# Patient Record
Sex: Male | Born: 1975 | Race: Black or African American | Hispanic: No | Marital: Single | Smoking: Current every day smoker
Health system: Southern US, Community
[De-identification: ages and names within clinical notes are randomized; demographics above are authoritative.]

---

## 2004-07-16 ENCOUNTER — Other Ambulatory Visit: Payer: Self-pay

## 2005-06-07 ENCOUNTER — Emergency Department: Payer: Self-pay | Admitting: Emergency Medicine

## 2006-04-18 ENCOUNTER — Emergency Department: Payer: Self-pay | Admitting: Emergency Medicine

## 2006-06-28 ENCOUNTER — Emergency Department: Payer: Self-pay | Admitting: Unknown Physician Specialty

## 2007-06-27 ENCOUNTER — Emergency Department: Payer: Self-pay | Admitting: Emergency Medicine

## 2008-05-22 ENCOUNTER — Emergency Department: Payer: Self-pay | Admitting: Emergency Medicine

## 2011-01-09 ENCOUNTER — Emergency Department: Payer: Self-pay | Admitting: Internal Medicine

## 2012-04-29 ENCOUNTER — Emergency Department: Payer: Self-pay | Admitting: Emergency Medicine

## 2012-07-22 ENCOUNTER — Emergency Department: Payer: Self-pay | Admitting: *Deleted

## 2012-07-22 LAB — URINALYSIS, COMPLETE
Bacteria: NONE SEEN
Bilirubin,UR: NEGATIVE
Leukocyte Esterase: NEGATIVE
Ph: 5 (ref 4.5–8.0)
Specific Gravity: 1.027 (ref 1.003–1.030)
Squamous Epithelial: <1

## 2012-07-22 LAB — COMPREHENSIVE METABOLIC PANEL
Albumin: 4.4 g/dL (ref 3.4–5.0)
Alkaline Phosphatase: 65 U/L (ref 50–136)
Anion Gap: 7 (ref 7–16)
Calcium, Total: 9.2 mg/dL (ref 8.5–10.1)
EGFR (Non-African Amer.): >60
Glucose: 84 mg/dL (ref 65–99)
Potassium: 3.3 mmol/L — ABNORMAL LOW (ref 3.5–5.1)
SGOT(AST): 57 U/L — ABNORMAL HIGH (ref 15–37)
SGPT (ALT): 51 U/L (ref 12–78)
Total Protein: 8.2 g/dL (ref 6.4–8.2)

## 2012-07-22 LAB — DRUG SCREEN, URINE
Amphetamines, Ur Screen: NEGATIVE (ref ?–1000)
Benzodiazepine, Ur Scrn: NEGATIVE (ref ?–200)
Cocaine Metabolite,Ur ~~LOC~~: POSITIVE (ref ?–300)
MDMA (Ecstasy)Ur Screen: NEGATIVE (ref ?–500)
Methadone, Ur Screen: NEGATIVE (ref ?–300)
Phencyclidine (PCP) Ur S: NEGATIVE (ref ?–25)
Tricyclic, Ur Screen: NEGATIVE (ref ?–1000)

## 2012-07-22 LAB — CBC
HGB: 15.3 g/dL (ref 13.0–18.0)
MCHC: 32.9 g/dL (ref 32.0–36.0)
MCV: 88 fL (ref 80–100)

## 2012-07-22 LAB — ETHANOL: Ethanol %: <0.003 % (ref 0.000–0.080)

## 2013-08-25 ENCOUNTER — Emergency Department: Payer: Self-pay | Admitting: Emergency Medicine

## 2014-03-25 ENCOUNTER — Emergency Department: Payer: Self-pay | Admitting: Emergency Medicine

## 2016-03-26 ENCOUNTER — Emergency Department: Payer: Self-pay

## 2016-03-26 ENCOUNTER — Emergency Department
Admission: EM | Admit: 2016-03-26 | Discharge: 2016-03-26 | Disposition: A | Discharge status: Home / Self Care | Payer: Self-pay | Attending: Emergency Medicine | Admitting: Emergency Medicine

## 2016-03-26 ENCOUNTER — Encounter: Payer: Self-pay | Admitting: Emergency Medicine

## 2016-03-26 DIAGNOSIS — M13862 Other specified arthritis, left knee: Secondary | ICD-10-CM | POA: Insufficient documentation

## 2016-03-26 DIAGNOSIS — F17200 Nicotine dependence, unspecified, uncomplicated: Secondary | ICD-10-CM | POA: Insufficient documentation

## 2016-03-26 DIAGNOSIS — M1712 Unilateral primary osteoarthritis, left knee: Secondary | ICD-10-CM

## 2016-03-26 MED ORDER — NAPROXEN 500 MG PO TABS: 500.0000 mg | ORAL_TABLET | Freq: Two times a day (BID) | ORAL | Status: DC

## 2016-03-26 NOTE — ED Notes (Signed)
Pt to ed with c/o left knee pain x 1 week, denies recent injury, but reports "it was a long time ago when I messed it up"

## 2016-03-26 NOTE — Discharge Instructions (Signed)

## 2016-03-26 NOTE — ED Provider Notes (Signed)
[facility] Emergency Department Provider Note  ____________________________________________  Time seen: Approximately 8:00 AM  I have reviewed the triage vital signs and the nursing notes.   HISTORY  Chief Complaint Knee Pain    HPI Tim Wallace is a 40 y.o. male , NAD, presents emergency department with 1 week history of left knee pain and swelling. States that his knee hurts more in the morning and decreases through the day with movement. Has mild pain most of the time. States that he twisted his knee approximately one week ago while walking and noted some swelling. Has been taking "fluid pills" from his mother to decrease the swelling which has worked. Has not taken anything over-the-counter for his pain. Denies any falls or traumas to the left knee. States he hurt his knee some years ago but has not had any follow-up with medical providers since that time. Denies numbness, weakness, tingling. Has not noted any redness or warmth to the area. No skin sores, open wounds nor oozing or weeping. Has not noted any edema in his lower extremities.   History reviewed. No pertinent past medical history.  There are no active problems to display for this patient.   History reviewed. No pertinent past surgical history.  Current Outpatient Rx  Name  Route  Sig  Dispense  Refill  . naproxen (NAPROSYN) 500 MG tablet   Oral   Take 1 tablet (500 mg total) by mouth 2 (two) times daily with a meal.   14 tablet   0     Allergies Review of patient's allergies indicates no known allergies.  History reviewed. No pertinent family history.  Social History Social History  Substance Use Topics  . Smoking status: Current Every Day Smoker  . Smokeless tobacco: None  . Alcohol Use: Yes     Review of Systems  Constitutional: No fever/chills, fatigue Cardiovascular: No chest pain. Respiratory: No shortness of breath. No wheezing.  Musculoskeletal: Positive left  knee pain. Negative for back and hip pain.  Skin: Negative for rash or redness, swelling, skin source. Neurological: Negative for headaches, focal weakness or numbness. No tingling. 10-point ROS otherwise negative.  ____________________________________________   PHYSICAL EXAM:  VITAL SIGNS: ED Triage Vitals  Enc Vitals Group     BP --      Pulse --      Resp --      Temp --      Temp src --      SpO2 --      Weight --      Height --      Head Cir --      Peak Flow --      Pain Score 03/26/16 0755 7     Pain Loc --      Pain Edu? --      Excl. in GC? --      Constitutional: Alert and oriented. Well appearing and in no acute distress. Eyes: Conjunctivae are normal.  Head: Atraumatic. Cardiovascular: Normal rate, regular rhythm. Normal S1 and S2.  Good peripheral circulation with 2+ pulses noted in the left lower extremity. Respiratory: Normal respiratory effort without tachypnea or retractions. Lungs CTAB with breath sounds noted in all lung fields. Musculoskeletal: Full range of motion of left knee with some posterior pain in full extension. No masses or swelling noted to the posterior knee. Negative patellofemoral grind test. No laxity with anterior, posterior drawer nor varus or valgus stress. Negative Lockman's on the left. No  lower extremity tenderness nor edema.  No joint effusions. Neurologic:  Normal speech and language. No gross focal neurologic deficits are appreciated.  Skin:  Skin is warm, dry and intact. No rash noted. Psychiatric: Mood and affect are normal. Speech and behavior are normal. Patient exhibits appropriate insight and judgement.   ____________________________________________   LABS  None ____________________________________________  EKG  None ____________________________________________  RADIOLOGY I have personally viewed and evaluated these images (plain radiographs) as part of my medical decision making, as well as reviewing the written  report by the radiologist.  Dg Knee Complete 4 Views Left  03/26/2016  CLINICAL DATA:  Knee pain EXAM: LEFT KNEE - COMPLETE 4+ VIEW COMPARISON:  None. FINDINGS: No acute fracture. No dislocation. Unremarkable soft tissues. Mild tricompartment osteoarthritic change. Small bone fragments are present in the distal quadriceps tendon, and distal patellar tendon. These have a chronic appearance. IMPRESSION: No acute bony pathology. Electronically Signed   By: Jolaine Click M.D.   On: 03/26/2016 08:29    ____________________________________________    PROCEDURES  Procedure(s) performed: None    Medications - No data to display   ____________________________________________   INITIAL IMPRESSION / ASSESSMENT AND PLAN / ED COURSE  Pertinent imaging results that were available during my care of the patient were reviewed by me and considered in my medical decision making (see chart for details).  Patient's diagnosis is consistent with arthritis of the left knee. Patient will be discharged home with prescriptions for naproxen to take as directed. Patient is to follow up with [facility] if symptoms persist past this treatment course. Patient is given ED precautions to return to the ED for any worsening or new symptoms.      ____________________________________________  FINAL CLINICAL IMPRESSION(S) / ED DIAGNOSES  Final diagnoses:  Arthritis of left knee      NEW MEDICATIONS STARTED DURING THIS VISIT:  Discharge Medication List as of 03/26/2016  9:16 AM    START taking these medications   Details  naproxen (NAPROSYN) 500 MG tablet Take 1 tablet (500 mg total) by mouth 2 (two) times daily with a meal., Starting 03/26/2016, Until Discontinued, Print         This chart was dictated using voice recognition software/Dragon. Despite best efforts to proofread, errors can occur which can change the meaning. Any change was purely unintentional.    Hope Pigeon,  PA-C 03/26/16 1025  Governor Rooks, MD 03/26/16 1317

## 2016-03-26 NOTE — ED Notes (Signed)
Pt states he has swelling in his left knee. He took one of his mother's "fluid pills" and it decreased the swelling but "it is still swollen and painful".

## 2016-03-26 NOTE — ED Notes (Signed)
Pt verbalized understanding of discharge instructions. NAD at this time. 

## 2017-05-03 ENCOUNTER — Emergency Department
Admission: EM | Admit: 2017-05-03 | Discharge: 2017-05-03 | Disposition: A | Discharge status: Home / Self Care | Payer: Self-pay | Attending: Emergency Medicine | Admitting: Emergency Medicine

## 2017-05-03 ENCOUNTER — Encounter: Payer: Self-pay | Admitting: Emergency Medicine

## 2017-05-03 DIAGNOSIS — F17200 Nicotine dependence, unspecified, uncomplicated: Secondary | ICD-10-CM | POA: Insufficient documentation

## 2017-05-03 DIAGNOSIS — L03011 Cellulitis of right finger: Secondary | ICD-10-CM | POA: Insufficient documentation

## 2017-05-03 MED ORDER — SULFAMETHOXAZOLE-TRIMETHOPRIM 800-160 MG PO TABS: 1.0000 | ORAL_TABLET | Freq: Two times a day (BID) | ORAL | 0 refills | Status: DC

## 2017-05-03 MED ORDER — TRAMADOL HCL 50 MG PO TABS: 50.0000 mg | ORAL_TABLET | Freq: Two times a day (BID) | ORAL | 0 refills | Status: DC | PRN

## 2017-05-03 NOTE — ED Triage Notes (Signed)
Pt c/o pain to right 3rd digit. Swelling to distal part of finger. No redness or fevers. No drainage. Pain with movement.  Pt does not remember injury.  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

## 2017-05-03 NOTE — ED Notes (Signed)
See triage note   States he developed pain and swelling to right middle finger about 4-5 days ago   Denies any injury increased pain with movement

## 2017-05-03 NOTE — ED Provider Notes (Signed)
[facility] Emergency Department Provider Note   ____________________________________________   First MD Initiated Contact with Patient 05/03/17 1550     (approximate)  I have reviewed the triage vital signs and the nursing notes.   HISTORY  Chief Complaint Hand Pain    HPI Tim DickinsonLarry D Wallace is a 41 y.o. male atient was some redness and swelling to the third digit right hand. Complaining is confined to the medial nailbed of the affected digit. Patient denies any trauma but admits to nail biting. Patient rates pain as 8/10. No palliative measures for this complaint.  History reviewed. No pertinent past medical history.  There are no active problems to display for this patient.   History reviewed. No pertinent surgical history.  Prior to Admission medications   Medication Sig Start Date End Date Taking? Authorizing Provider  naproxen (NAPROSYN) 500 MG tablet Take 1 tablet (500 mg total) by mouth 2 (two) times daily with a meal. 03/26/16   Hagler, Jami L, PA-C  sulfamethoxazole-trimethoprim (BACTRIM DS,SEPTRA DS) 800-160 MG tablet Take 1 tablet by mouth 2 (two) times daily. 05/03/17   Joni ReiningSmith, Kamaljit Hizer K, PA-C  traMADol (ULTRAM) 50 MG tablet Take 1 tablet (50 mg total) by mouth every 12 (twelve) hours as needed. 05/03/17   Joni ReiningSmith, Shantanique Hodo K, PA-C    Allergies Patient has no known allergies.  History reviewed. No pertinent family history.  Social History Social History  Substance Use Topics  . Smoking status: Current Every Day Smoker  . Smokeless tobacco: Never Used  . Alcohol use Yes    Review of Systems  Constitutional: No fever/chills Eyes: No visual changes. ENT: No sore throat. Cardiovascular: Denies chest pain. Respiratory: Denies shortness of breath. Gastrointestinal: No abdominal pain.  No nausea, no vomiting.  No diarrhea.  No constipation. Genitourinary: Negative for dysuria. Musculoskeletal: Negative for back pain. Skin: Negative for  rash. Redness and swelling to the third digit right nailbed. Neurological: Negative for headaches, focal weakness or numbness.   ____________________________________________   PHYSICAL EXAM:  VITAL SIGNS: ED Triage Vitals  Enc Vitals Group     BP 05/03/17 1534 129/72     Pulse Rate 05/03/17 1534 63     Resp 05/03/17 1534 18     Temp 05/03/17 1534 98.3 F (36.8 C)     Temp Source 05/03/17 1534 Oral     SpO2 05/03/17 1534 94 %     Weight 05/03/17 1534 170 lb (77.1 kg)     Height 05/03/17 1534 5\' 7"  (1.702 m)     Head Circumference --      Peak Flow --      Pain Score 05/03/17 1533 8     Pain Loc --      Pain Edu? --      Excl. in GC? --     Constitutional: Alert and oriented. Well appearing and in no acute distress. Cardiovascular: Normal rate, regular rhythm. Grossly normal heart sounds.  Good peripheral circulation. Respiratory: Normal respiratory effort.  No retractions. Lungs CTAB. Neurologic:  Normal speech and language. No gross focal neurologic deficits are appreciated. No gait instability. Skin:  edema and erythema to the nailbed of the third digit right hand. Psychiatric: Mood and affect are normal. Speech and behavior are normal.  ____________________________________________   LABS (all labs ordered are listed, but only abnormal results are displayed)  Labs Reviewed - No data to display ____________________________________________  EKG   ____________________________________________  RADIOLOGY   ____________________________________________   PROCEDURES  Procedure(s) performed: patient refuses I&D  Procedures  Critical Care performed: No  ____________________________________________   INITIAL IMPRESSION / ASSESSMENT AND PLAN / ED COURSE  Pertinent labs & imaging results that were available during my care of the patient were reviewed by me and considered in my medical decision making (see chart for details).  Paronychia third digit right  hand. Patient refused to have the lesion incised and drained. Patient given discharge care instructions. Patient given a prescription for Bactrim DS and tramadol. Advised to follow[facility] if condition persists.      ____________________________________________   FINAL CLINICAL IMPRESSION(S) / ED DIAGNOSES  Final diagnoses:  Paronychia of finger, right      NEW MEDICATIONS STARTED DURING THIS VISIT:  New Prescriptions   SULFAMETHOXAZOLE-TRIMETHOPRIM (BACTRIM DS,SEPTRA DS) 800-160 MG TABLET    Take 1 tablet by mouth 2 (two) times daily.   TRAMADOL (ULTRAM) 50 MG TABLET    Take 1 tablet (50 mg total) by mouth every 12 (twelve) hours as needed.     Note:  This document was prepared using Dragon voice recognition software and may include unintentional dictation errors.    Joni Reining, PA-C 05/03/17 1601    Minna Antis, MD 05/04/17 1426

## 2017-05-03 NOTE — Discharge Instructions (Signed)
Epsom salt soak twice a day for 5 minutes.

## 2017-09-24 IMAGING — CR DG KNEE COMPLETE 4+V*L*
4 series · 4 of 4 positions shown · non-contrast
Comparison: None.

CLINICAL DATA: Knee pain

EXAM:
LEFT KNEE - COMPLETE 4+ VIEW

[knee ap]
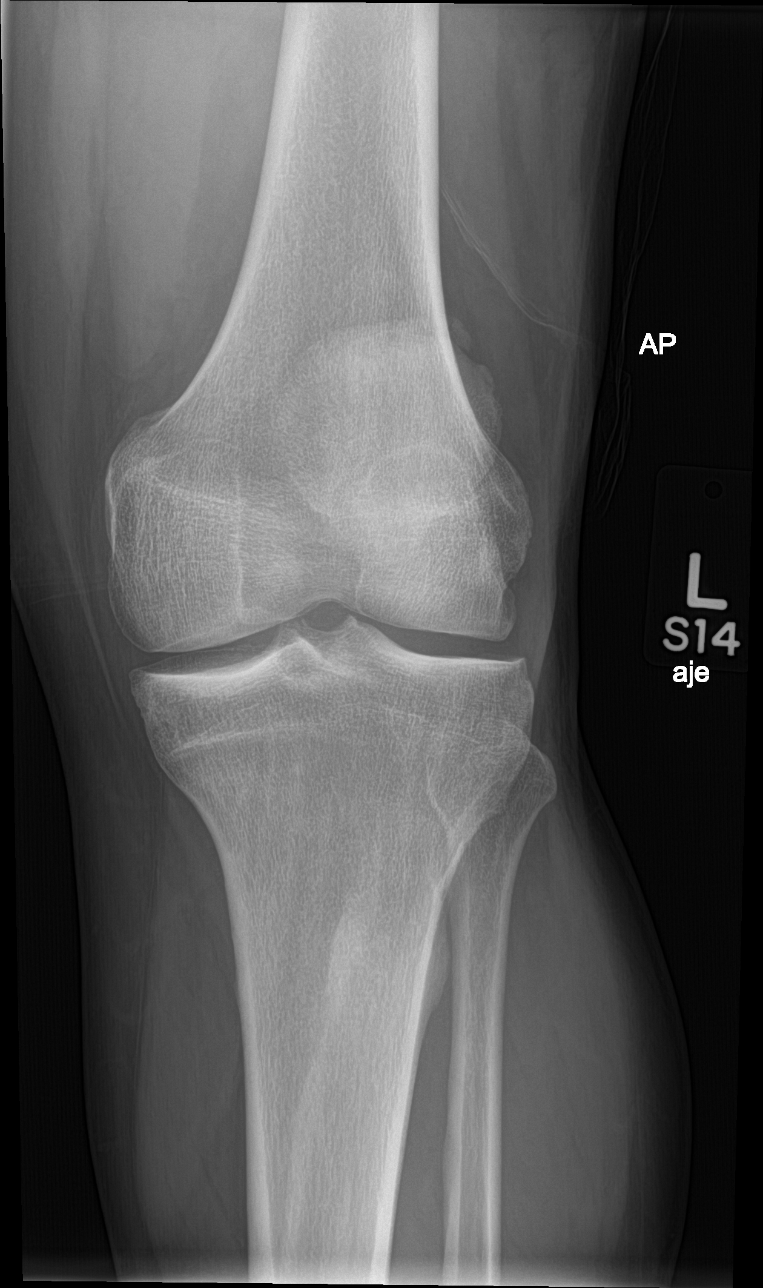

[knee obl]
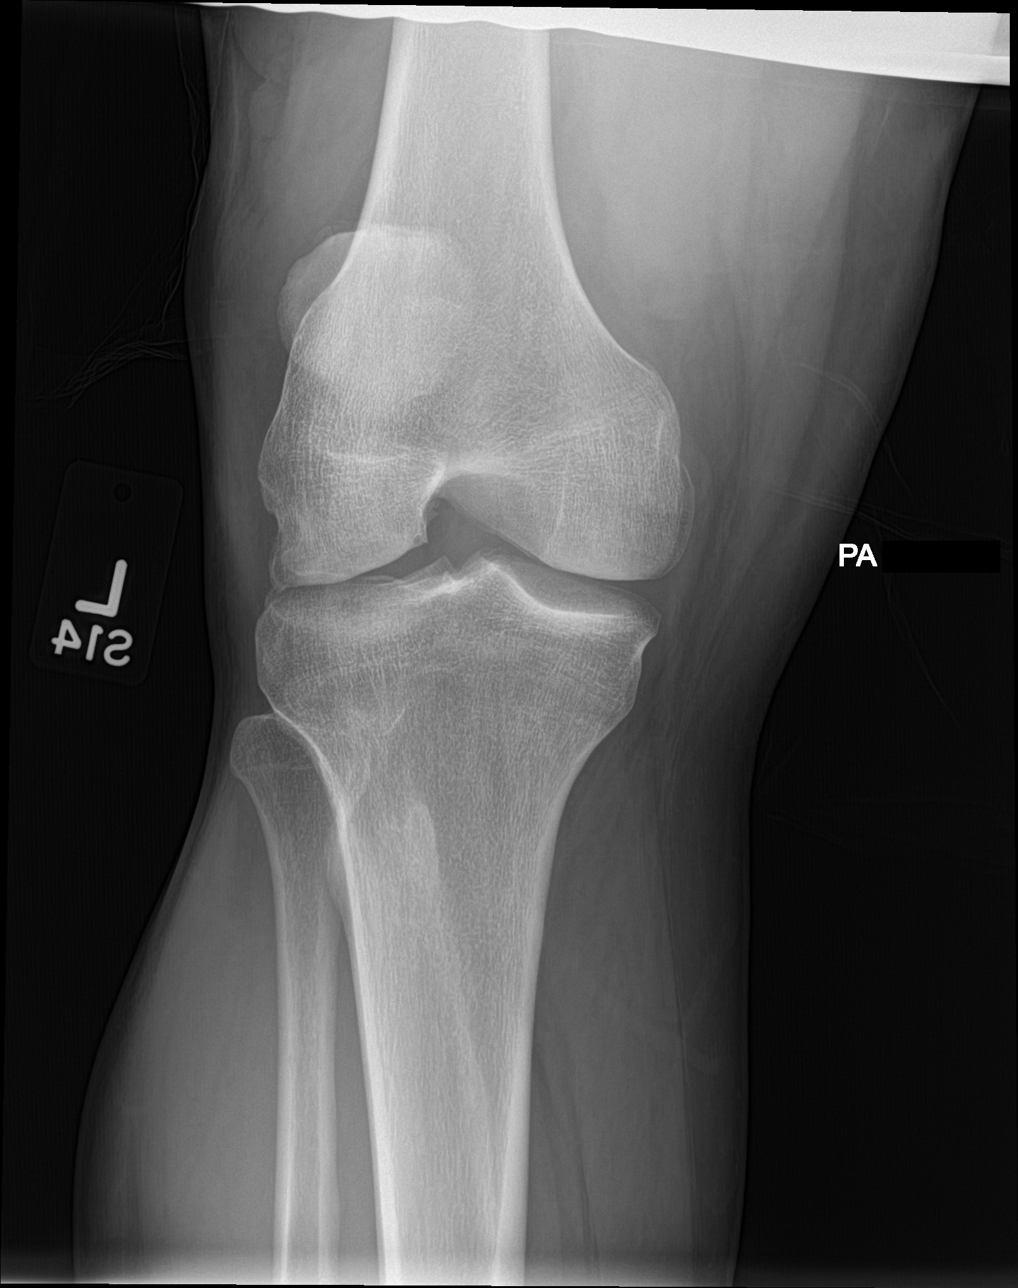

[knee lat]
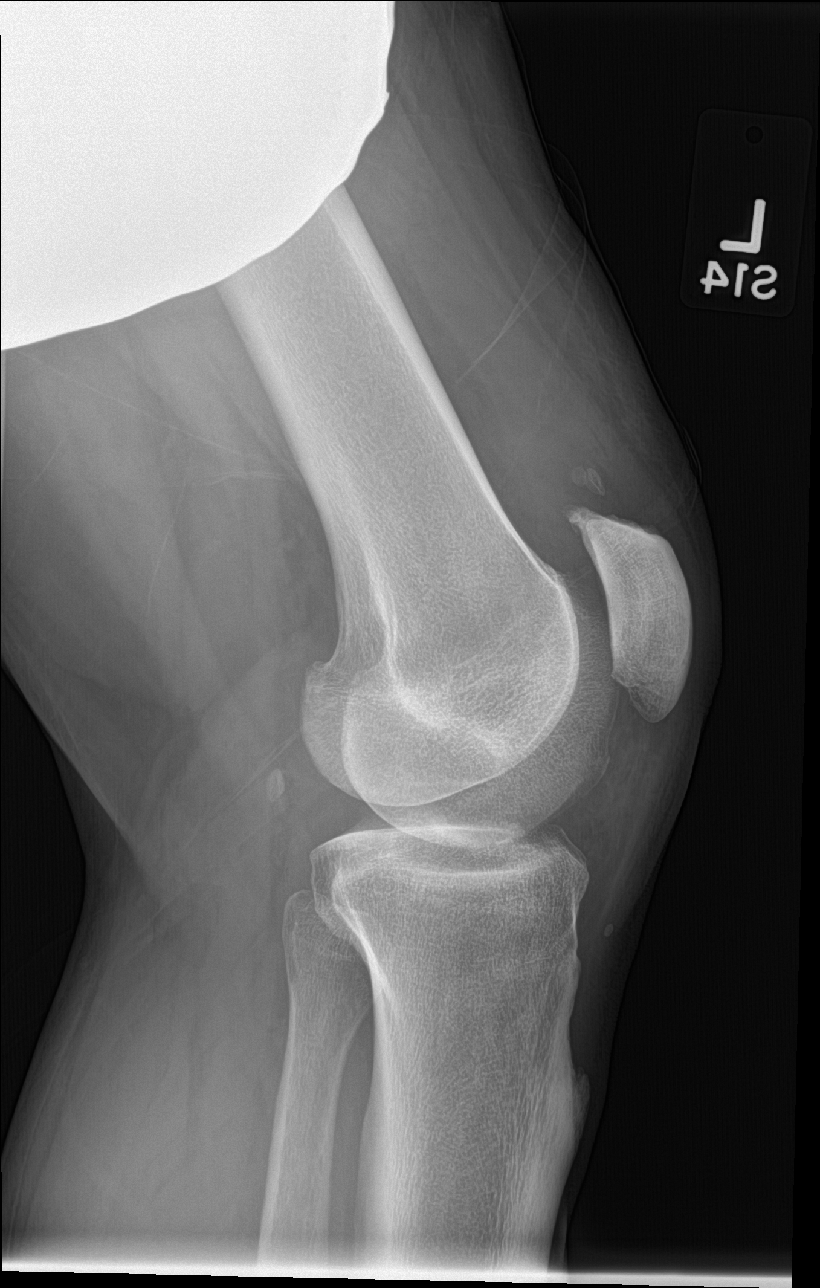

[sunrise]
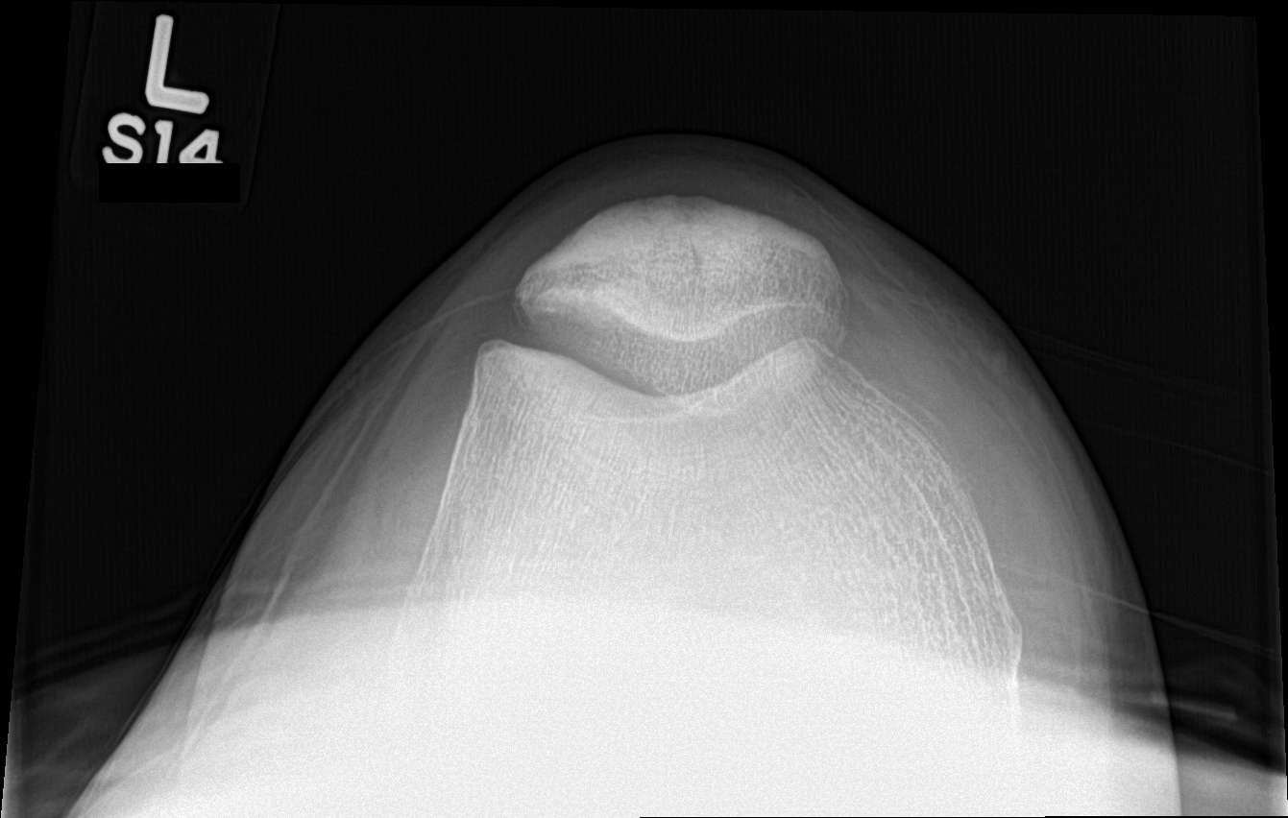

[4 of 4 positions shown; findings below may reference images not displayed]

FINDINGS: No acute fracture. No dislocation. Unremarkable soft tissues. Mild
tricompartment osteoarthritic change. Small bone fragments are
present in the distal quadriceps tendon, and distal patellar tendon.
These have a chronic appearance.
IMPRESSION: No acute bony pathology.

## 2020-11-20 ENCOUNTER — Other Ambulatory Visit: Payer: Self-pay

## 2020-11-20 ENCOUNTER — Encounter: Payer: Self-pay | Admitting: *Deleted

## 2020-11-20 ENCOUNTER — Emergency Department
Admission: EM | Admit: 2020-11-20 | Discharge: 2020-11-20 | Disposition: A | Discharge status: Home / Self Care | Payer: Self-pay | Attending: Emergency Medicine | Admitting: Emergency Medicine

## 2020-11-20 DIAGNOSIS — J029 Acute pharyngitis, unspecified: Secondary | ICD-10-CM | POA: Insufficient documentation

## 2020-11-20 DIAGNOSIS — F17200 Nicotine dependence, unspecified, uncomplicated: Secondary | ICD-10-CM | POA: Insufficient documentation

## 2020-11-20 LAB — GROUP A STREP BY PCR: Group A Strep by PCR: NOT DETECTED

## 2020-11-20 MED ORDER — DEXAMETHASONE 10 MG/ML FOR PEDIATRIC ORAL USE
10.0000 mg | Freq: Once | INTRAMUSCULAR | Status: AC
  Administered 2020-11-20: 10 mg via ORAL
  Filled 2020-11-20: qty 1

## 2020-11-20 MED ORDER — AMOXICILLIN 875 MG PO TABS: 875.0000 mg | ORAL_TABLET | Freq: Two times a day (BID) | ORAL | 0 refills | Status: AC

## 2020-11-20 NOTE — ED Triage Notes (Signed)
Pt to ED reporting throat pain and increased pain with swallowing x 3 days. No fevers.

## 2020-11-20 NOTE — ED Provider Notes (Signed)
[facility] Emergency Department Provider Note   ____________________________________________   Event Date/Time   First MD Initiated Contact with Patient 11/20/20 [phone]     (approximate)  I have reviewed the triage vital signs and the nursing notes.   HISTORY  Chief Complaint Sore Throat    HPI Tim Wallace is a 44 y.o. male with no significant past Casimiro Needle history presents to the ED complaining of sore throat.  Patient reports that he has had gradually worsening throat pain for the past 3 days.  He describes it as a throbbing that is worse when he goes to swallow or drink anything.  He has been tolerating p.o. without difficulty and denies any nausea or vomiting.  He has not had any fevers, cough, chest pain, or shortness of breath.  He denies any recent sick contacts.        History reviewed. No pertinent past medical history.  There are no problems to display for this patient.   History reviewed. No pertinent surgical history.  Prior to Admission medications   Medication Sig Start Date End Date Taking? Authorizing Provider  amoxicillin (AMOXIL) 875 MG tablet Take 1 tablet (875 mg total) by mouth 2 (two) times daily for 7 days. 11/20/20 11/27/20  Chesley Noon, MD  naproxen (NAPROSYN) 500 MG tablet Take 1 tablet (500 mg total) by mouth 2 (two) times daily with a meal. 03/26/16   Hagler, Jami L, PA-C  sulfamethoxazole-trimethoprim (BACTRIM DS,SEPTRA DS) 800-160 MG tablet Take 1 tablet by mouth 2 (two) times daily. 05/03/17   Joni Reining, PA-C  traMADol (ULTRAM) 50 MG tablet Take 1 tablet (50 mg total) by mouth every 12 (twelve) hours as needed. 05/03/17   Joni Reining, PA-C    Allergies Patient has no known allergies.  History reviewed. No pertinent family history.  Social History Social History   Tobacco Use  . Smoking status: Current Every Day Smoker  . Smokeless tobacco: Never Used  Substance Use Topics  . Alcohol use: Yes  .  Drug use: Yes    Types: Marijuana    Review of Systems  Constitutional: No fever/chills Eyes: No visual changes. ENT: Positive for sore throat. Cardiovascular: Denies chest pain. Respiratory: Denies shortness of breath. Gastrointestinal: No abdominal pain.  No nausea, no vomiting.  No diarrhea.  No constipation. Genitourinary: Negative for dysuria. Musculoskeletal: Negative for back pain. Skin: Negative for rash. Neurological: Negative for headaches, focal weakness or numbness.  ____________________________________________   PHYSICAL EXAM:  VITAL SIGNS: ED Triage Vitals  Enc Vitals Group     BP 11/20/20 0107 122/71     Pulse Rate 11/20/20 0107 70     Resp 11/20/20 0107 16     Temp 11/20/20 0107 98.1 F (36.7 C)     Temp Source 11/20/20 0107 Oral     SpO2 11/20/20 0107 97 %     Weight 11/20/20 0109 169 lb 15.6 oz (77.1 kg)     Height 11/20/20 0108 5\' 7"  (1.702 m)     Head Circumference --      Peak Flow --      Pain Score 11/20/20 0108 7     Pain Loc --      Pain Edu? --      Excl. in GC? --     Constitutional: Alert and oriented. Eyes: Conjunctivae are normal. Head: Atraumatic. Nose: No congestion/rhinnorhea. Mouth/Throat: Mucous membranes are moist.  Oropharyngeal edema and erythema noted with no tonsillar exudates. Neck: Normal  ROM Cardiovascular: Normal rate, regular rhythm. Grossly normal heart sounds. Respiratory: Normal respiratory effort.  No retractions. Lungs CTAB. Gastrointestinal: Soft and nontender. No distention. Genitourinary: deferred Musculoskeletal: No lower extremity tenderness nor edema. Neurologic:  Normal speech and language. No gross focal neurologic deficits are appreciated. Skin:  Skin is warm, dry and intact. No rash noted. Psychiatric: Mood and affect are normal. Speech and behavior are normal.  ____________________________________________   LABS (all labs ordered are listed, but only abnormal results are displayed)  Labs  Reviewed  GROUP A STREP BY PCR    PROCEDURES  Procedure(s) performed (including Critical Care):  Procedures   ____________________________________________   INITIAL IMPRESSION / ASSESSMENT AND PLAN / ED COURSE       44 year old male with no significant past medical history presents to the ED complaining of worsening sore throat for the past 3 days.  He is in no distress and tolerating secretions without difficulty.  He has posterior pharyngeal edema and erythema but no exudates, strep testing is negative.  Despite this, patient insist that symptoms are similar to prior episodes of strep pharyngitis and requests antibiotics, we will treat with amoxicillin and also give dose of Decadron.  Patient is appropriate for discharge home with PCP follow-up, was counseled to return to the ED for new worsening symptoms, patient agrees with plan.      ____________________________________________   FINAL CLINICAL IMPRESSION(S) / ED DIAGNOSES  Final diagnoses:  Pharyngitis, unspecified etiology     ED Discharge Orders         Ordered    amoxicillin (AMOXIL) 875 MG tablet  2 times daily        11/20/20 0411           Note:  This document was prepared using Dragon voice recognition software and may include unintentional dictation errors.   Chesley Noon, MD 11/20/20 [phone]

## 2021-03-25 ENCOUNTER — Emergency Department
Admission: EM | Admit: 2021-03-25 | Discharge: 2021-03-25 | Disposition: A | Discharge status: Home / Self Care | Payer: Self-pay | Attending: Emergency Medicine | Admitting: Emergency Medicine

## 2021-03-25 ENCOUNTER — Other Ambulatory Visit: Payer: Self-pay

## 2021-03-25 ENCOUNTER — Emergency Department: Payer: Self-pay

## 2021-03-25 DIAGNOSIS — M25552 Pain in left hip: Secondary | ICD-10-CM | POA: Insufficient documentation

## 2021-03-25 DIAGNOSIS — R52 Pain, unspecified: Secondary | ICD-10-CM

## 2021-03-25 DIAGNOSIS — F17200 Nicotine dependence, unspecified, uncomplicated: Secondary | ICD-10-CM | POA: Insufficient documentation

## 2021-03-25 NOTE — ED Provider Notes (Signed)
[facility] Emergency Department Provider Note  Time seen: 5:20 PM  I have reviewed the triage vital signs and the nursing notes.   HISTORY  Chief Complaint Hip Pain   HPI Tim Wallace is a 45 y.o. male with no significant past medical history who presents to the emergency department for left hip pain.  According to the patient with the past 6 months he has been experiencing pain in his left hip that has progressively worsened.  Patient states he does not have pain all the time but notices when he gets up and walks around he will develop pain in the left hip and the more he uses it the more it hurts.  Patient states yesterday he noticed a sharp pain shoot down his leg which is what prompted today's ER visit.  Patient denies any weakness or numbness of the leg.  Denies any trauma or injury.  No known history of arthritis.   No past medical history on file.  There are no problems to display for this patient.   No past surgical history on file.  Prior to Admission medications   Medication Sig Start Date End Date Taking? Authorizing Provider  naproxen (NAPROSYN) 500 MG tablet Take 1 tablet (500 mg total) by mouth 2 (two) times daily with a meal. 03/26/16   Hagler, Jami L, PA-C  sulfamethoxazole-trimethoprim (BACTRIM DS,SEPTRA DS) 800-160 MG tablet Take 1 tablet by mouth 2 (two) times daily. 05/03/17   Joni Reining, PA-C  traMADol (ULTRAM) 50 MG tablet Take 1 tablet (50 mg total) by mouth every 12 (twelve) hours as needed. 05/03/17   Joni Reining, PA-C    No Known Allergies  No family history on file.  Social History Social History   Tobacco Use  . Smoking status: Current Every Day Smoker  . Smokeless tobacco: Never Used  Substance Use Topics  . Alcohol use: Yes  . Drug use: Yes    Types: Marijuana    Review of Systems Constitutional: Negative for fever. Cardiovascular: Negative for chest pain. Respiratory: Negative for shortness of  breath. Gastrointestinal: Negative for abdominal pain Musculoskeletal: Moderate left hip pain with ambulation.  No pain at rest. Skin: Negative for skin complaints  Neurological: Negative for headache All other ROS negative  ____________________________________________   PHYSICAL EXAM:  VITAL SIGNS: ED Triage Vitals  Enc Vitals Group     BP 03/25/21 1716 (!) 160/81     Pulse Rate 03/25/21 1716 70     Resp 03/25/21 1716 18     Temp 03/25/21 1716 98.1 F (36.7 C)     Temp Source 03/25/21 1716 Oral     SpO2 03/25/21 1716 97 %     Weight 03/25/21 1719 185 lb (83.9 kg)     Height 03/25/21 1719 5\' 9"  (1.753 m)     Head Circumference --      Peak Flow --      Pain Score 03/25/21 1719 3     Pain Loc --      Pain Edu? --      Excl. in GC? --     Constitutional: Alert and oriented. Well appearing and in no distress. Eyes: Normal exam ENT      Head: Normocephalic and atraumatic.      Mouth/Throat: Mucous membranes are moist. Cardiovascular: Normal rate, regular rhythm. Respiratory: Normal respiratory effort without tachypnea nor retractions. Breath sounds are clear Gastrointestinal: Soft and nontender. No distention.   Musculoskeletal: Nontender with normal range  of motion in all extremities Neurologic:  Normal speech and language. No gross focal neurologic deficits Skin:  Skin is warm, dry and intact.  Psychiatric: Mood and affect are normal.  ____________________________________________    RADIOLOGY  Hip x-rays negative  ____________________________________________   INITIAL IMPRESSION / ASSESSMENT AND PLAN / ED COURSE  Pertinent labs & imaging results that were available during my care of the patient were reviewed by me and considered in my medical decision making (see chart for details).   Patient presents emergency department for left hip pain with ambulation.  Nontender currently.  No pain elicited with rotational range of motion on my examination.  Neurovascular  intact distally.  Denies any trauma however given the patient's description of worsening pain over the past 6 months we will proceed with x-ray imaging of the left hip to further evaluate.  Patient agreeable to plan of care.  Differential can include arthritis, degenerative changes, less likely fracture, no clinical concern for dislocation.  Patient's hip x-ray is negative for acute abnormality.  Will refer to orthopedic for further evaluation.  Patient agreeable plan of care.  Tim Wallace was evaluated in Emergency Department on 03/25/2021 for the symptoms described in the history of present illness. He was evaluated in the context of the global COVID-19 pandemic, which necessitated consideration that the patient might be at risk for infection with the SARS-CoV-2 virus that causes COVID-19. Institutional protocols and algorithms that pertain to the evaluation of patients at risk for COVID-19 are in a state of rapid change based on information released by regulatory bodies including the CDC and federal and state organizations. These policies and algorithms were followed during the patient's care in the ED.  ____________________________________________   FINAL CLINICAL IMPRESSION(S) / ED DIAGNOSES  Left hip pain   Minna Antis, MD 03/25/21 [phone]

## 2021-03-25 NOTE — ED Triage Notes (Signed)
Pt c/o L hip pain x 6 months which became worse and with accompanied tingling since last night. Pt sts pain and tingling radiate down to just above his L knee. Pt denies injury or fall. Ambulated with steady gait from triage.  Pain worse with walking.

## 2021-03-25 NOTE — ED Notes (Signed)
ED Provider at bedside. 

## 2023-02-03 ENCOUNTER — Encounter: Payer: Self-pay | Admitting: Emergency Medicine

## 2023-02-03 ENCOUNTER — Other Ambulatory Visit: Payer: Self-pay

## 2023-02-03 ENCOUNTER — Emergency Department
Admission: EM | Admit: 2023-02-03 | Discharge: 2023-02-03 | Disposition: A | Discharge status: Home / Self Care | Payer: Self-pay | Attending: Student in an Organized Health Care Education/Training Program | Admitting: Student in an Organized Health Care Education/Training Program

## 2023-02-03 DIAGNOSIS — R0981 Nasal congestion: Secondary | ICD-10-CM | POA: Insufficient documentation

## 2023-02-03 DIAGNOSIS — F17210 Nicotine dependence, cigarettes, uncomplicated: Secondary | ICD-10-CM | POA: Insufficient documentation

## 2023-02-03 DIAGNOSIS — B338 Other specified viral diseases: Secondary | ICD-10-CM

## 2023-02-03 DIAGNOSIS — R059 Cough, unspecified: Secondary | ICD-10-CM | POA: Insufficient documentation

## 2023-02-03 DIAGNOSIS — J3489 Other specified disorders of nose and nasal sinuses: Secondary | ICD-10-CM | POA: Insufficient documentation

## 2023-02-03 DIAGNOSIS — Z1152 Encounter for screening for COVID-19: Secondary | ICD-10-CM | POA: Insufficient documentation

## 2023-02-03 DIAGNOSIS — B974 Respiratory syncytial virus as the cause of diseases classified elsewhere: Secondary | ICD-10-CM | POA: Insufficient documentation

## 2023-02-03 LAB — RESP PANEL BY RT-PCR (RSV, FLU A&B, COVID)  RVPGX2
Influenza A by PCR: NEGATIVE
Influenza B by PCR: NEGATIVE
Resp Syncytial Virus by PCR: POSITIVE — AB
SARS Coronavirus 2 by RT PCR: NEGATIVE

## 2023-02-03 MED ORDER — PSEUDOEPH-BROMPHEN-DM 30-2-10 MG/5ML PO SYRP: 5.0000 mL | ORAL_SOLUTION | Freq: Four times a day (QID) | ORAL | 0 refills | Status: DC | PRN

## 2023-02-03 NOTE — Discharge Instructions (Signed)
Follow-up with your primary care provider if any continued problems or concerns.  You may also follow-up with [facility] if needed.  If you do not already have a primary care provider call the office is listed on your discharge papers.  A prescription for cough medication was sent to the pharmacy to take as needed.  Continue to increase fluids to stay hydrated and consider decreasing or stop smoking.

## 2023-02-03 NOTE — ED Provider Notes (Signed)
[facility] Provider Note    Event Date/Time   First MD Initiated Contact with Patient 02/03/23 [phone]     (approximate)   History   Nasal Congestion   HPI  Tim Wallace is a 47 y.o. male   presents to the ED with complaint of cough, congestion and rhinorrhea for several days.  Patient is unaware of any fever.  He continues to smoke 1 pack of cigarettes per day.  No previous history of asthma.  No known sick contacts.  Patient is taking some over-the-counter medications for symptoms including Benadryl and Sudafed.  He reports  occasional sneezing.      Physical Exam   Triage Vital Signs: ED Triage Vitals  Enc Vitals Group     BP 02/03/23 0700 117/77     Pulse Rate 02/03/23 0700 77     Resp 02/03/23 0700 18     Temp 02/03/23 0700 97.8 F (36.6 C)     Temp Source 02/03/23 0700 Oral     SpO2 02/03/23 0700 100 %     Weight 02/03/23 0657 175 lb (79.4 kg)     Height 02/03/23 0657 '5\' 8"'$  (1.727 m)     Head Circumference --      Peak Flow --      Pain Score 02/03/23 0657 0     Pain Loc --      Pain Edu? --      Excl. in [place]? --     Most recent vital signs: Vitals:   02/03/23 0700  BP: 117/77  Pulse: 77  Resp: 18  Temp: 97.8 F (36.6 C)  SpO2: 100%     General: Awake, no distress.  Able to talk in complete sentences without any difficulty. CV:  Good peripheral perfusion.  Heart regular rate and rhythm. Resp:  Normal effort.  Lungs are clear bilaterally.  No wheezing. Abd:  No distention.  Other:             Nasal congestion noted.   ED Results / Procedures / Treatments   Labs (all labs ordered are listed, but only abnormal results are displayed) Labs Reviewed  RESP PANEL BY RT-PCR (RSV, FLU A&B, COVID)  RVPGX2 - Abnormal; Notable for the following components:      Result Value   Resp Syncytial Virus by PCR POSITIVE (*)    All other components within normal limits      PROCEDURES:  Critical Care performed:    Procedures   MEDICATIONS ORDERED IN ED: Medications - No data to display   IMPRESSION / MDM / [place] / ED COURSE  I reviewed the triage vital signs and the nursing notes.   Differential diagnosis includes, but is not limited to, influenza, COVID, RSV, upper respiratory infection, seasonal allergies.  47 year old male presents to the ED with complaint of cough and congestion for several days without known sick exposure.  Physical exam is benign with exception of some nasal congestion.  Patient was made aware that he is positive for RSV.  He can continue taking the over-the-counter medications that he has been taking for his symptoms also Bromfed-DM was sent to the pharmacy to take as needed for cough.  Increase fluids an over-the-counter antipyretics if needed for fever.  Follow-up with his PCP if any continued problems or concerns.      Patient's presentation is most consistent with acute complicated illness / injury requiring diagnostic workup.  FINAL CLINICAL IMPRESSION(S) / ED DIAGNOSES  Final diagnoses:  RSV infection     Rx / DC Orders   ED Discharge Orders          Ordered    brompheniramine-pseudoephedrine-DM 30-2-10 MG/5ML syrup  4 times daily PRN        02/03/23 0802             Note:  This document was prepared using Dragon voice recognition software and may include unintentional dictation errors.   Johnn Hai, PA-C 02/03/23 Azzie Almas    Merlyn Lot, MD 02/03/23 [phone]

## 2023-02-03 NOTE — ED Notes (Signed)
10 yom with a c/c of sinus issues. The pt advised he has been having sinus issues for the past 2 to 3 days. The pt was ambulatory to the room without incident. The pt is alert and oriented x 4, warm, pink, and dry.

## 2023-02-03 NOTE — ED Triage Notes (Signed)
Patient ambulatory to triage with steady gait, without difficulty or distress noted; pt reports sinus congestion and prod cough clear sputum

## 2023-04-06 ENCOUNTER — Emergency Department: Payer: Managed Care, Other (non HMO)

## 2023-04-06 ENCOUNTER — Emergency Department
Admission: EM | Admit: 2023-04-06 | Discharge: 2023-04-06 | Disposition: A | Discharge status: Home / Self Care | Payer: Managed Care, Other (non HMO) | Attending: Emergency Medicine | Admitting: Emergency Medicine

## 2023-04-06 ENCOUNTER — Other Ambulatory Visit: Payer: Self-pay

## 2023-04-06 ENCOUNTER — Encounter: Payer: Self-pay | Admitting: Emergency Medicine

## 2023-04-06 DIAGNOSIS — M25561 Pain in right knee: Secondary | ICD-10-CM | POA: Insufficient documentation

## 2023-04-06 MED ORDER — MELOXICAM 15 MG PO TABS: 15.0000 mg | ORAL_TABLET | Freq: Every day | ORAL | 1 refills | Status: AC

## 2023-04-06 NOTE — ED Provider Notes (Signed)
[facility] Provider Note  Patient Contact: 8:30 PM (approximate)   History   Knee Pain   HPI  Tim Wallace is a 47 y.o. male presents to the emergency department with right knee pain that started last night.  Patient localizes pain to the lateral aspect of his right knee.  No falls or mechanisms of trauma to his knowledge.  Patient denies prior right knee injuries.  Patient has been able to bear weight but states that he was concerned because he was unable to go to work today.  No erythema overlying the right knee.      Physical Exam   Triage Vital Signs: ED Triage Vitals  Enc Vitals Group     BP 04/06/23 1550 132/87     Pulse Rate 04/06/23 1550 (!) 56     Resp 04/06/23 1550 18     Temp 04/06/23 1550 98.6 F (37 C)     Temp Source 04/06/23 1550 Oral     SpO2 04/06/23 1550 98 %     Weight --      Height --      Head Circumference --      Peak Flow --      Pain Score 04/06/23 1549 5     Pain Loc --      Pain Edu? --      Excl. in [place]? --     Most recent vital signs: Vitals:   04/06/23 1550  BP: 132/87  Pulse: (!) 56  Resp: 18  Temp: 98.6 F (37 C)  SpO2: 98%     General: Alert and in no acute distress. Eyes:  PERRL. EOMI. Head: No acute traumatic findings ENT:      Nose: No congestion/rhinnorhea.      Mouth/Throat: Mucous membranes are moist. Neck: No stridor. No cervical spine tenderness to palpation. Cardiovascular:  Good peripheral perfusion Respiratory: Normal respiratory effort without tachypnea or retractions. Lungs CTAB. Good air entry to the bases with no decreased or absent breath sounds. Gastrointestinal: Bowel sounds 4 quadrants. Soft and nontender to palpation. No guarding or rigidity. No palpable masses. No distention. No CVA tenderness. Musculoskeletal: Full range of motion to all extremities.  No loss of peripatellar dimpling on the right.  No deficits appreciated with provocative testing.  Palpable dorsalis  pedis pulse, right. Neurologic:  No gross focal neurologic deficits are appreciated.  Skin:   No rash noted    ED Results / Procedures / Treatments   Labs (all labs ordered are listed, but only abnormal results are displayed) Labs Reviewed - No data to display     RADIOLOGY  I personally viewed and evaluated these images as part of my medical decision making, as well as reviewing the written report by the radiologist.  ED Provider Interpretation: X-ray of the right knee shows no acute abnormality.   PROCEDURES:  Critical Care performed: No  Procedures   MEDICATIONS ORDERED IN ED: Medications - No data to display   IMPRESSION / MDM / ASSESSMENT AND PLAN / ED COURSE  I reviewed the triage vital signs and the nursing notes.                              Assessment and plan Right knee pain 47 year old male presents to the emergency department with acute right knee pain.  Patient was mildly bradycardic at triage but vital signs otherwise reassuring.  On exam, patient alert,  active and nontoxic-appearing.  X-ray of the right knee shows no acute bony abnormality.  Patient was discharged with daily meloxicam and given orthopedic follow-up.  Return precautions were given to return with new or worsening symptoms.      FINAL CLINICAL IMPRESSION(S) / ED DIAGNOSES   Final diagnoses:  Acute pain of right knee     Rx / DC Orders   ED Discharge Orders          Ordered    meloxicam (MOBIC) 15 MG tablet  Daily        04/06/23 1644             Note:  This document was prepared using Dragon voice recognition software and may include unintentional dictation errors.   Pia Mau [place], Cordelia Poche 04/06/23 2034    Corena Herter, MD 04/06/23 2037

## 2023-04-06 NOTE — ED Triage Notes (Signed)
Patient to ED via POV for right knee pain. No new injury, hurting since last PM. Walking with limp.

## 2023-04-06 NOTE — Discharge Instructions (Signed)
Take Meloxicam once daily for pain and inflammation. Apply ice to right knee. Keep knee elevated at night.

## 2023-07-13 ENCOUNTER — Emergency Department
Admission: EM | Admit: 2023-07-13 | Discharge: 2023-07-13 | Disposition: A | Discharge status: Home / Self Care | Payer: 59 | Attending: Emergency Medicine | Admitting: Emergency Medicine

## 2023-07-13 ENCOUNTER — Other Ambulatory Visit: Payer: Self-pay

## 2023-07-13 DIAGNOSIS — M2142 Flat foot [pes planus] (acquired), left foot: Secondary | ICD-10-CM | POA: Insufficient documentation

## 2023-07-13 DIAGNOSIS — M7989 Other specified soft tissue disorders: Secondary | ICD-10-CM | POA: Diagnosis present

## 2023-07-13 DIAGNOSIS — M2141 Flat foot [pes planus] (acquired), right foot: Secondary | ICD-10-CM | POA: Insufficient documentation

## 2023-07-13 MED ORDER — MELOXICAM 15 MG PO TABS: 15.0000 mg | ORAL_TABLET | Freq: Every day | ORAL | 0 refills | Status: AC

## 2023-07-13 NOTE — ED Triage Notes (Signed)
Pt to ED via POV from home. Pt reports right foot swelling since Monday. Pt denies injury.

## 2023-07-13 NOTE — ED Provider Notes (Signed)
[facility] Provider Note    Event Date/Time   First MD Initiated Contact with Patient 07/13/23 1713     (approximate)   History   Foot Pain   HPI  Tim Wallace is a 47 y.o. male with no PMH who presents for evaluation of foot swelling.  Patient states he has pain and swelling to the medial side of his right foot.  He is on his feet a lot for work and says that this makes it worse.  He has taken some ibuprofen this week but that has not helped.      Physical Exam   Triage Vital Signs: ED Triage Vitals [07/13/23 1620]  Encounter Vitals Group     BP (!) 130/91     Systolic BP Percentile      Diastolic BP Percentile      Pulse Rate (!) 58     Resp 18     Temp 98.2 F (36.8 C)     Temp Source Oral     SpO2 98 %     Weight 187 lb (84.8 kg)     Height 5\' 8"  (1.727 m)     Head Circumference      Peak Flow      Pain Score 6     Pain Loc      Pain Education      Exclude from Growth Chart     Most recent vital signs: Vitals:   07/13/23 1620  BP: (!) 130/91  Pulse: (!) 58  Resp: 18  Temp: 98.2 F (36.8 C)  SpO2: 98%     General: Awake, no distress.  CV:  Good peripheral perfusion.  Resp:  Normal effort.  Abd:  No distention.  Other:  TTP over the medial talus, when standing patient has bilateral fallen arches, worse on the right side.   ED Results / Procedures / Treatments   Labs (all labs ordered are listed, but only abnormal results are displayed) Labs Reviewed - No data to display    PROCEDURES:  Critical Care performed: No  Procedures   MEDICATIONS ORDERED IN ED: Medications - No data to display   IMPRESSION / MDM / ASSESSMENT AND PLAN / ED COURSE  I reviewed the triage vital signs and the nursing notes.                              Differential diagnosis includes, but is not limited to, fracture, plantar fasciitis, pes planus, ankle sprain, foot sprain.  Patient's presentation is most consistent with  acute, uncomplicated illness.  Since patient has not had a specific injury I do not feel x-rays are necessary at this time.  It is possible he has some arthritis that could be identified with an x-ray, however this would not change my management.  Diagnosis is most consistent with pes planus.  I advised patient to get some foot orthotics to help support his arch.  I will also start him on an anti-inflammatory and advised him to take Tylenol as needed for pain.  He will be given a referral for primary care provider as well as podiatrist.  I recommend that he follow-up with podiatry if he continues to have pain after taking the anti-inflammatory for 2 weeks and trying a foot orthotic.  Voiced understanding, all questions were answered and he was stable at discharge.      FINAL CLINICAL IMPRESSION(S) / ED DIAGNOSES  Final diagnoses:  Pes planus of both feet     Rx / DC Orders   ED Discharge Orders          Ordered    meloxicam (MOBIC) 15 MG tablet  Daily        07/13/23 1744             Note:  This document was prepared using Dragon voice recognition software and may include unintentional dictation errors.   Cameron Ali, PA-C 07/13/23 1746    Corena Herter, MD 07/13/23 2046

## 2023-07-13 NOTE — Discharge Instructions (Addendum)
Please take the meloxicam once a day for 2 weeks.  You can take 650 mg of Tylenol every 6 hours as needed for pain.  Look for a foot orthotic specifically for flat feet. If you continue to have pain after taking the anti-inflammatory for 2 weeks and trying orthotics I recommend you schedule an appointment with podiatry who's information is attached.

## 2023-10-06 ENCOUNTER — Emergency Department
Admission: EM | Admit: 2023-10-06 | Discharge: 2023-10-06 | Disposition: A | Discharge status: Home / Self Care | Payer: Worker's Compensation | Attending: Emergency Medicine | Admitting: Emergency Medicine

## 2023-10-06 ENCOUNTER — Other Ambulatory Visit: Payer: Self-pay

## 2023-10-06 ENCOUNTER — Emergency Department: Payer: Worker's Compensation

## 2023-10-06 DIAGNOSIS — S838X2A Sprain of other specified parts of left knee, initial encounter: Secondary | ICD-10-CM | POA: Diagnosis not present

## 2023-10-06 DIAGNOSIS — M25562 Pain in left knee: Secondary | ICD-10-CM

## 2023-10-06 DIAGNOSIS — S8992XA Unspecified injury of left lower leg, initial encounter: Secondary | ICD-10-CM | POA: Diagnosis present

## 2023-10-06 DIAGNOSIS — Y990 Civilian activity done for income or pay: Secondary | ICD-10-CM | POA: Diagnosis not present

## 2023-10-06 DIAGNOSIS — X500XXA Overexertion from strenuous movement or load, initial encounter: Secondary | ICD-10-CM | POA: Diagnosis not present

## 2023-10-06 MED ORDER — MELOXICAM 15 MG PO TABS: 15.0000 mg | ORAL_TABLET | Freq: Every day | ORAL | 2 refills | Status: AC

## 2023-10-06 NOTE — Discharge Instructions (Signed)
Take the anti-inflammatory as prescribed

## 2023-10-06 NOTE — ED Provider Notes (Signed)
[facility] Provider Note    Event Date/Time   First MD Initiated Contact with Patient 10/06/23 1607     (approximate)   History   Leg Pain   HPI  Tim Wallace is a 47 y.o. male with no significant past medical history presents emergency department with left leg pain and swelling started today.  Patient states he stepped and twisted hearing a pop in the left knee.  Pain.  His boss told him to come to the emergency department.  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  He denies head injury, numbness or tingling.      Physical Exam   Triage Vital Signs: ED Triage Vitals [10/06/23 1534]  Encounter Vitals Group     BP 118/83     Systolic BP Percentile      Diastolic BP Percentile      Pulse Rate 66     Resp 18     Temp 98 F (36.7 C)     Temp Source Oral     SpO2 98 %     Weight 185 lb (83.9 kg)     Height 5\' 9"  (1.753 m)     Head Circumference      Peak Flow      Pain Score      Pain Loc      Pain Education      Exclude from Growth Chart     Most recent vital signs: Vitals:   10/06/23 1534  BP: 118/83  Pulse: 66  Resp: 18  Temp: 98 F (36.7 C)  SpO2: 98%     General: Awake, no distress.   CV:  Good peripheral perfusion. regular rate and  rhythm Resp:  Normal effort.  Abd:  No distention.   Other:  Left posterior knee tender to palpation, tender at the joint line, full range of motion, neurovascular intact   ED Results / Procedures / Treatments   Labs (all labs ordered are listed, but only abnormal results are displayed) Labs Reviewed - No data to display   EKG     RADIOLOGY Ultrasound left lower extremity, x-ray left knee    PROCEDURES:   Procedures   MEDICATIONS ORDERED IN ED: Medications - No data to display   IMPRESSION / MDM / ASSESSMENT AND PLAN / ED COURSE  I reviewed the triage vital signs and the nursing notes.                              Differential diagnosis includes, but is  not limited to, DVT, sprain, meniscus tear, fracture  Patient's presentation is most consistent with acute illness / injury with system symptoms.   Ultrasound left lower extremity ordered from triage, independently reviewed interpreted by me as being negative for acute abnormality  X-ray of the left knee independently reviewed interpreted by me as being negative for any acute abnormality  I did explain the findings to patient.  Placed in a knee immobilizer.  Given prescription for meloxicam.  Work restrictions will include no deep knee bending.  Need to wear the knee brace for 1 week.  He is able to drive as he will not be on narcotic.  Follow-up with orthopedics not improved in 1 week.  Discharged in stable condition.      FINAL CLINICAL IMPRESSION(S) / ED DIAGNOSES   Final diagnoses:  Acute pain of left knee  Sprain of other ligament  of left knee, initial encounter     Rx / DC Orders   ED Discharge Orders          Ordered    meloxicam (MOBIC) 15 MG tablet  Daily        10/06/23 1814             Note:  This document was prepared using Dragon voice recognition software and may include unintentional dictation errors.    Faythe Ghee, PA-C 10/06/23 Royetta Asal, MD 10/07/23 [phone]

## 2023-10-06 NOTE — ED Provider Triage Note (Signed)
Emergency Medicine Provider Triage Evaluation Note  Tim Wallace , a 47 y.o. male  was evaluated in triage.  Pt complains of left leg pain that started today. Pain is behind the left knee  Review of Systems  Positive: Pain in left leg, weakness of the left leg Negative: Numbness, tingling,   Physical Exam  BP 118/83 (BP Location: Right Arm)   Pulse 66   Temp 98 F (36.7 C) (Oral)   Resp 18   Ht 5\' 9"  (1.753 m)   Wt 83.9 kg   SpO2 98%   BMI 27.32 kg/m  Gen:   Awake, no distress   Resp:  Normal effort  MSK:   Moves extremities without difficulty, walks with a limp and cane Other:    Medical Decision Making  Medically screening exam initiated at 3:39 PM.  Appropriate orders placed.  Lenis Dickinson was informed that the remainder of the evaluation will be completed by another provider, this initial triage assessment does not replace that evaluation, and the importance of remaining in the ED until their evaluation is complete.     Cameron Ali, PA-C 10/06/23 1542

## 2023-10-06 NOTE — ED Triage Notes (Signed)
Pt presents with L leg pain and swelling that started today. Denies injury or wounds.

## 2024-10-16 ENCOUNTER — Emergency Department
Admission: EM | Admit: 2024-10-16 | Discharge: 2024-10-16 | Disposition: A | Discharge status: Home / Self Care | Payer: Self-pay | Attending: Emergency Medicine | Admitting: Emergency Medicine

## 2024-10-16 ENCOUNTER — Other Ambulatory Visit: Payer: Self-pay

## 2024-10-16 ENCOUNTER — Emergency Department: Payer: Self-pay

## 2024-10-16 ENCOUNTER — Encounter: Payer: Self-pay | Admitting: Emergency Medicine

## 2024-10-16 DIAGNOSIS — M25562 Pain in left knee: Secondary | ICD-10-CM | POA: Insufficient documentation

## 2024-10-16 DIAGNOSIS — M25462 Effusion, left knee: Secondary | ICD-10-CM | POA: Insufficient documentation

## 2024-10-16 MED ORDER — MELOXICAM 15 MG PO TABS: 15.0000 mg | ORAL_TABLET | Freq: Every day | ORAL | 0 refills | Status: AC

## 2024-10-16 NOTE — Discharge Instructions (Signed)
 Follow-up with orthopedics.  Return emergency department for worsening.  See your regular doctor as needed.  Apply ice to the knee.  May apply it through the knee immobilizer.  You need to wear the knee immobilizer when up and walking.

## 2024-10-16 NOTE — ED Triage Notes (Signed)
 Patient to ED via POV for left knee pain. Ongoing since Sunday. Denies new injury. States he believes there is fluid on it.

## 2024-10-16 NOTE — ED Notes (Signed)
 Resting at present  Awaiting U/S results

## 2024-10-16 NOTE — ED Provider Notes (Signed)
 [facility] Provider Note    Event Date/Time   First MD Initiated Contact with Patient 10/16/24 1307     (approximate)   History   Knee Pain   HPI  Tim Wallace is a 48 y.o. male with no significant past medical history presents emergency department complaint of left knee pain.  Symptoms since Sunday.  No new injury.  States it has been swollen and he took a fluid pill to help with fluid.  States some pain in the back of his calf and around the knee.  States is a little better today.  No numbness or tingling.  No redness      Physical Exam   Triage Vital Signs: ED Triage Vitals  Encounter Vitals Group     BP 10/16/24 1251 (!) 127/93     Girls Systolic BP Percentile --      Girls Diastolic BP Percentile --      Boys Systolic BP Percentile --      Boys Diastolic BP Percentile --      Pulse Rate 10/16/24 1251 86     Resp 10/16/24 1251 17     Temp 10/16/24 1251 98.6 F (37 C)     Temp Source 10/16/24 1251 Oral     SpO2 10/16/24 1251 95 %     Weight 10/16/24 1249 175 lb (79.4 kg)     Height 10/16/24 1249 5' 9 (1.753 m)     Head Circumference --      Peak Flow --      Pain Score 10/16/24 1249 8     Pain Loc --      Pain Education --      Exclude from Growth Chart --     Most recent vital signs: Vitals:   10/16/24 1251  BP: (!) 127/93  Pulse: 86  Resp: 17  Temp: 98.6 F (37 C)  SpO2: 95%     General: Awake, no distress.   CV:  Good peripheral perfusion.  Resp:  Normal effort. Abd:  No distention.   Other:  Left knee with some swelling, tenderness of the posterior knee and calf, full range of motion, neurovascular intact   ED Results / Procedures / Treatments   Labs (all labs ordered are listed, but only abnormal results are displayed) Labs Reviewed - No data to display   EKG     RADIOLOGY X-ray left knee, ultrasound for DVT    PROCEDURES:   Procedures  Critical Care:  no Chief Complaint  Patient presents  with   Knee Pain      MEDICATIONS ORDERED IN ED: Medications - No data to display   IMPRESSION / MDM / ASSESSMENT AND PLAN / ED COURSE  I reviewed the triage vital signs and the nursing notes.                              Differential diagnosis includes, but is not limited to, knee pain, knee effusion, Baker's cyst, septic arthritis, DVT  Patient's presentation is most consistent with acute illness / injury with system symptoms.   X-ray of the left knee, independently reviewed interpreted by me as having a very small knee effusion, confirmed by radiology  Ultrasound left lower extremity for DVT, independent review and interpretation as negative for DVT  I did explain findings to patient.  Placement knee immobilizer.  He is asking for a work note and for light  duty.  I do agree he does not need to do any deep knee bending or heavy lifting until the knee begins to heal.  He is to follow-up with orthopedics.   Prescription for meloxicam  sent to his pharmacy.  He is in agreement treatment plan.  Discharged stable condition.   FINAL CLINICAL IMPRESSION(S) / ED DIAGNOSES   Final diagnoses:  Acute pain of left knee  Knee effusion, left     Rx / DC Orders   ED Discharge Orders          Ordered    meloxicam  (MOBIC ) 15 MG tablet  Daily        10/16/24 1605             Note:  This document was prepared using Dragon voice recognition software and may include unintentional dictation errors.    Gasper Devere ORN, PA-C 10/16/24 1605    Floy Roberts, MD 10/17/24 [phone]

## 2024-10-16 NOTE — ED Notes (Signed)
 See triage note  Presents with pain to left knee  States he notice pain with some minor swelling on Sunday  Denies any injury
